# Patient Record
Sex: Male | Born: 1960 | Race: White | Hispanic: No | Marital: Married | Smoking: Never smoker
Health system: Southern US, Community
[De-identification: ages and names within clinical notes are randomized; demographics above are authoritative.]

## PROBLEM LIST (undated history)

## (undated) DIAGNOSIS — I10 Essential (primary) hypertension: Secondary | ICD-10-CM

---

## 2017-08-17 ENCOUNTER — Encounter (HOSPITAL_BASED_OUTPATIENT_CLINIC_OR_DEPARTMENT_OTHER): Payer: Self-pay

## 2017-08-17 ENCOUNTER — Emergency Department (HOSPITAL_BASED_OUTPATIENT_CLINIC_OR_DEPARTMENT_OTHER)
Admission: EM | Admit: 2017-08-17 | Discharge: 2017-08-17 | Disposition: A | Discharge status: Home / Self Care | Payer: 59 | Attending: Emergency Medicine | Admitting: Emergency Medicine

## 2017-08-17 ENCOUNTER — Other Ambulatory Visit: Payer: Self-pay

## 2017-08-17 ENCOUNTER — Emergency Department (HOSPITAL_BASED_OUTPATIENT_CLINIC_OR_DEPARTMENT_OTHER): Payer: 59

## 2017-08-17 DIAGNOSIS — Y9389 Activity, other specified: Secondary | ICD-10-CM | POA: Diagnosis not present

## 2017-08-17 DIAGNOSIS — S61312A Laceration without foreign body of right middle finger with damage to nail, initial encounter: Secondary | ICD-10-CM | POA: Diagnosis not present

## 2017-08-17 DIAGNOSIS — S62639A Displaced fracture of distal phalanx of unspecified finger, initial encounter for closed fracture: Secondary | ICD-10-CM | POA: Insufficient documentation

## 2017-08-17 DIAGNOSIS — W312XXA Contact with powered woodworking and forming machines, initial encounter: Secondary | ICD-10-CM | POA: Insufficient documentation

## 2017-08-17 DIAGNOSIS — Z23 Encounter for immunization: Secondary | ICD-10-CM | POA: Diagnosis not present

## 2017-08-17 DIAGNOSIS — I10 Essential (primary) hypertension: Secondary | ICD-10-CM | POA: Insufficient documentation

## 2017-08-17 DIAGNOSIS — S6981XA Other specified injuries of right wrist, hand and finger(s), initial encounter: Secondary | ICD-10-CM | POA: Diagnosis present

## 2017-08-17 DIAGNOSIS — Y999 Unspecified external cause status: Secondary | ICD-10-CM | POA: Diagnosis not present

## 2017-08-17 DIAGNOSIS — Y929 Unspecified place or not applicable: Secondary | ICD-10-CM | POA: Insufficient documentation

## 2017-08-17 HISTORY — DX: Essential (primary) hypertension: I10

## 2017-08-17 MED ORDER — TETANUS-DIPHTH-ACELL PERTUSSIS 5-2.5-18.5 LF-MCG/0.5 IM SUSP
0.5000 mL | Freq: Once | INTRAMUSCULAR | Status: AC
  Administered 2017-08-17: 0.5 mL via INTRAMUSCULAR
  Filled 2017-08-17: qty 0.5

## 2017-08-17 MED ORDER — OXYCODONE-ACETAMINOPHEN 5-325 MG PO TABS
1.0000 | ORAL_TABLET | Freq: Once | ORAL | Status: AC
  Administered 2017-08-17: 1 via ORAL
  Filled 2017-08-17: qty 1

## 2017-08-17 MED ORDER — LIDOCAINE HCL (PF) 1 % IJ SOLN
15.0000 mL | Freq: Once | INTRAMUSCULAR | Status: AC
  Administered 2017-08-17: 15 mL
  Filled 2017-08-17: qty 15

## 2017-08-17 MED ORDER — OXYCODONE-ACETAMINOPHEN 5-325 MG PO TABS: 1.0000 | ORAL_TABLET | Freq: Three times a day (TID) | ORAL | 0 refills | Status: DC | PRN

## 2017-08-17 MED ORDER — CEPHALEXIN 500 MG PO CAPS: 500.0000 mg | ORAL_CAPSULE | Freq: Four times a day (QID) | ORAL | 0 refills | Status: AC

## 2017-08-17 MED FILL — OXYCODONE-ACETAMINOPHEN 5-3: 5-325 | 4 days supply | Qty: 12 | Fill #0

## 2017-08-17 MED FILL — CEPHALEXIN 500 MG CAPSULE: 500 | 7 days supply | Qty: 28 | Fill #0

## 2017-08-17 NOTE — Discharge Instructions (Signed)
You were seen in the ED today with a laceration to the right middle finger. I repaired the laceration with stitches that will need to be removed in 1 week. Follow up with your PCP and the hand specialist listed. Take antibiotics as directed. Return to the ED sooner with any drainage from the wound, finger redness, fever, or sudden worsening pain.

## 2017-08-17 NOTE — ED Notes (Signed)
Pt to call PCP ask about DTap

## 2017-08-17 NOTE — ED Notes (Signed)
ED Provider at bedside. 

## 2017-08-17 NOTE — ED Triage Notes (Addendum)
Pt cut right middle finger with saw approx 30 min PTA-sent from PCP-dsg intact with no bleed through-NAD-steady gait

## 2017-08-17 NOTE — ED Provider Notes (Signed)
Emergency Department Provider Note   I have reviewed the triage vital signs and the nursing notes.   HISTORY  Chief Complaint Finger Injury   HPI Shane Tate is a 57 y.o. male resents to the emergency department for evaluation after injury to the right middle finger.  Patient was cutting some insulation material with a saw when he inadvertently cut his finger.  He has an injury through the tip of the right middle finger.  He is right-hand dominant.  No numbness or tingling in the finger.  He had pain immediately with significant bleeding which was controlled with direct pressure and he presented to the emergency department.  No additional areas were injured.  To needs to have some soreness in the finger.  No radiation of symptoms or modifying factors. Pain is moderate.    Past Medical History:  Diagnosis Date  . Hypertension     There are no active problems to display for this patient.   History reviewed. No pertinent surgical history.  Current Outpatient Rx  . Order #: [id number] Class: Historical Med  . Order #: [id number] Class: Print  . Order #: [id number] Class: Print    Allergies Sulfa antibiotics  No family history on file.  Social History Social History   Tobacco Use  . Smoking status: Never Smoker  . Smokeless tobacco: Never Used  Substance Use Topics  . Alcohol use: Yes    Frequency: Never    Comment: daily  . Drug use: No    Review of Systems  Constitutional: No fever/chills Eyes: No visual changes. ENT: No sore throat. Cardiovascular: Denies chest pain. Respiratory: Denies shortness of breath. Gastrointestinal: No abdominal pain.  No nausea, no vomiting.  No diarrhea.  No constipation. Genitourinary: Negative for dysuria. Musculoskeletal: Negative for back pain.  Skin: Negative for rash. Positive finger laceration.  Neurological: Negative for headaches, focal weakness or numbness.  10-point ROS otherwise  negative.  ____________________________________________   PHYSICAL EXAM:  VITAL SIGNS: ED Triage Vitals  Enc Vitals Group     BP 08/17/17 1421 (!) 140/97     Pulse Rate 08/17/17 1421 61     Resp 08/17/17 1421 16     Temp 08/17/17 1421 98.2 F (36.8 C)     Temp Source 08/17/17 1421 Oral     SpO2 08/17/17 1421 98 %     Weight 08/17/17 1421 200 lb 14.4 oz (91.1 kg)     Height 08/17/17 1421 5\' 11"  (1.803 m)     Pain Score 08/17/17 1419 5   Constitutional: Alert and oriented. Well appearing and in no acute distress. Eyes: Conjunctivae are normal. Head: Atraumatic. Nose: No congestion/rhinnorhea. Mouth/Throat: Mucous membranes are moist.   Neck: No stridor.   Cardiovascular: Good peripheral circulation.  Respiratory: Normal respiratory effort.  Gastrointestinal: No distention.  Musculoskeletal: No lower extremity tenderness nor edema. No gross deformities of extremities. Neurologic:  Normal speech and language. No gross focal neurologic deficits are appreciated.  Skin:  Skin is warm, dry and intact. Laceration through the distal right middle finger extending 0.25 cm into distal nail. Does not involve the germinal matrix. Total laceration length is 2 cm.   ____________________________________________  RADIOLOGY  Dg Finger Middle Right  Result Date: 08/17/2017 CLINICAL DATA:  Right middle finger laceration from a saw today. EXAM: RIGHT MIDDLE FINGER 2+V COMPARISON:  None. FINDINGS: Soft tissue irregularity and mildly comminuted underlying fracture of the ulnar, dorsal aspect of the 3rd distal tuft. There are small ventrally displaced fragments. Overlying bandage material.  IMPRESSION: Distal soft tissue laceration with an underlying mildly comminuted distal tuft fracture. Electronically Signed   By: Beckie SaltsSteven  Reid M.D.   On: 08/17/2017 14:34    ____________________________________________   PROCEDURES  Procedure(s) performed:   Marland Kitchen.Marland Kitchen.Laceration Repair Date/Time: 08/18/2017 1:06  PM Performed by: Maia PlanLong, Itai Barbian G, MD Authorized by: Maia PlanLong, Valen Mascaro G, MD   Consent:    Consent obtained:  Verbal   Consent given by:  Patient   Risks discussed:  Infection, nerve damage, need for additional repair, pain, poor cosmetic result, poor wound healing, vascular damage, tendon damage and retained foreign body   Alternatives discussed:  No treatment Anesthesia (see MAR for exact dosages):    Anesthesia method:  Nerve block   Block needle gauge:  25 G   Block anesthetic:  Lidocaine 1% w/o epi   Block technique:  Digital   Block injection procedure:  Anatomic landmarks identified, anatomic landmarks palpated, introduced needle, negative aspiration for blood and incremental injection   Block outcome:  Anesthesia achieved Laceration details:    Location:  Finger   Finger location:  R Tailyn Hantz finger   Length (cm):  2 Repair type:    Repair type:  Intermediate Pre-procedure details:    Preparation:  Patient was prepped and draped in usual sterile fashion and imaging obtained to evaluate for foreign bodies Exploration:    Hemostasis achieved with:  Direct pressure   Wound exploration: wound explored through full range of motion and entire depth of wound probed and visualized     Wound extent: underlying fracture and vascular damage     Wound extent: no foreign bodies/material noted, no muscle damage noted, no nerve damage noted and no tendon damage noted     Contaminated: no   Treatment:    Area cleansed with:  Betadine   Amount of cleaning:  Extensive   Irrigation solution:  Sterile saline   Irrigation method:  Pressure wash   Visualized foreign bodies/material removed: no   Skin repair:    Repair method:  Sutures   Suture size:  4-0   Suture material:  Prolene   Suture technique:  Simple interrupted   Number of sutures:  4 Approximation:    Approximation:  Close   Vermilion border: well-aligned   Post-procedure details:    Dressing:  Antibiotic ointment   Patient tolerance  of procedure:  Tolerated well, no immediate complications   ____________________________________________  INITIAL IMPRESSION / ASSESSMENT AND PLAN / ED COURSE  Pertinent labs & imaging results that were available during my care of the patient were reviewed by me and considered in my medical decision making (see chart for details).  Patient presents to the ED for evaluation of right middle finger pain and bleeding after cutting it with a saw. The laceration wraps around the distal tip of the right middle finger and involves a small portion of the distal nail. Germinal matrix of nailbed not involved and the fingernail with well-seated. Small underlying tuft fracture. Laceration cleaned and repaired as above. Patient started on Keflex and will f/u with PCP and Hand Surgery. Discussed timing for suture removal and wound care in detail.   At this time, I do not feel there is any life-threatening condition present. I have reviewed and discussed all results (EKG, imaging, lab, urine as appropriate), exam findings with patient. I have reviewed nursing notes and appropriate previous records.  I feel the patient is safe to be discharged home without further emergent workup. Discussed usual and customary return precautions.  Patient and family (if present) verbalize understanding and are comfortable with this plan.  Patient will follow-up with their primary care provider. If they do not have a primary care provider, information for follow-up has been provided to them. All questions have been answered.  ____________________________________________  FINAL CLINICAL IMPRESSION(S) / ED DIAGNOSES  Final diagnoses:  Laceration of right middle finger without foreign body with damage to nail, initial encounter  Closed fracture of tuft of distal phalanx of finger     MEDICATIONS GIVEN DURING THIS VISIT:  Medications  Tdap (BOOSTRIX) injection 0.5 mL (0.5 mLs Intramuscular Given 08/17/17 1534)  lidocaine (PF)  (XYLOCAINE) 1 % injection 15 mL (15 mLs Infiltration Given by Other 08/17/17 1538)  oxyCODONE-acetaminophen (PERCOCET/ROXICET) 5-325 MG per tablet 1 tablet (1 tablet Oral Given 08/17/17 1546)     NEW OUTPATIENT MEDICATIONS STARTED DURING THIS VISIT:  Discharge Medication List as of 08/17/2017  4:31 PM    START taking these medications   Details  cephALEXin (KEFLEX) 500 MG capsule Take 1 capsule (500 mg total) by mouth 4 (four) times daily for 7 days., Starting Wed 08/17/2017, Until Wed 08/24/2017, Print    oxyCODONE-acetaminophen (PERCOCET/ROXICET) 5-325 MG tablet Take 1 tablet by mouth every 8 (eight) hours as needed for severe pain., Starting Wed 08/17/2017, Print        Note:  This document was prepared using Dragon voice recognition software and may include unintentional dictation errors.  Alona Bene, MD Emergency Medicine    Lilyonna Steidle, Arlyss Repress, MD 08/18/17 1310

## 2018-07-21 ENCOUNTER — Inpatient Hospital Stay (HOSPITAL_COMMUNITY): Admission: RE | Admit: 2018-07-21 | Payer: 59 | Source: Ambulatory Visit

## 2018-07-31 ENCOUNTER — Inpatient Hospital Stay: Admit: 2018-07-31 | Payer: 59 | Admitting: Orthopedic Surgery

## 2018-07-31 SURGERY — ARTHROPLASTY, KNEE, TOTAL
Anesthesia: Spinal | Laterality: Left

## 2019-05-28 IMAGING — DX DG FINGER MIDDLE 2+V*R*
3 series · 3 of 3 positions shown · non-contrast
Comparison: None.

CLINICAL DATA: Right middle finger laceration from a saw today.

EXAM:
RIGHT MIDDLE FINGER 2+V

[finger ap]
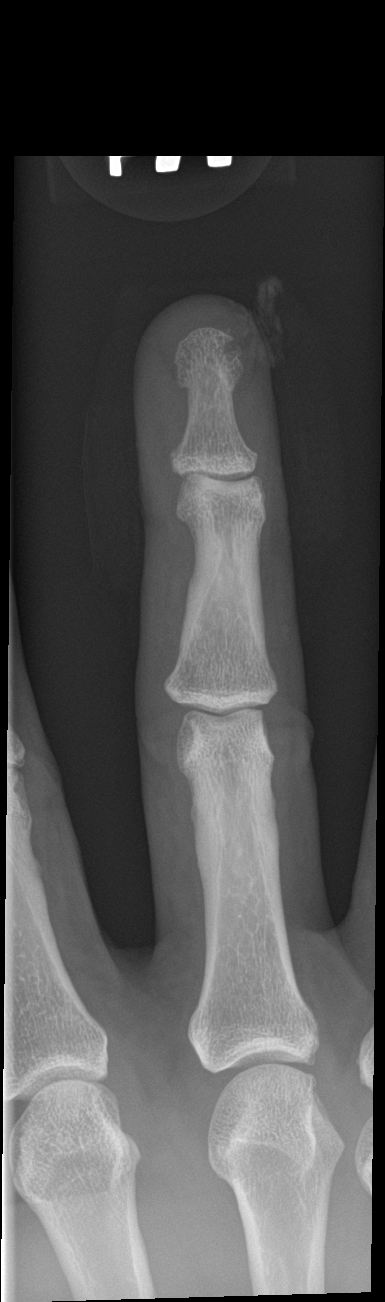

[finger obl]
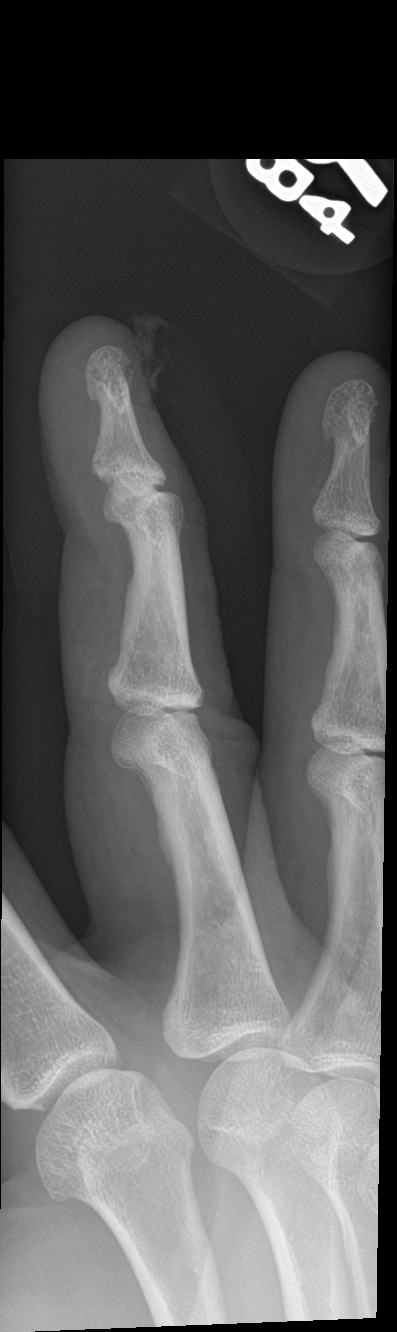

[finger lat]
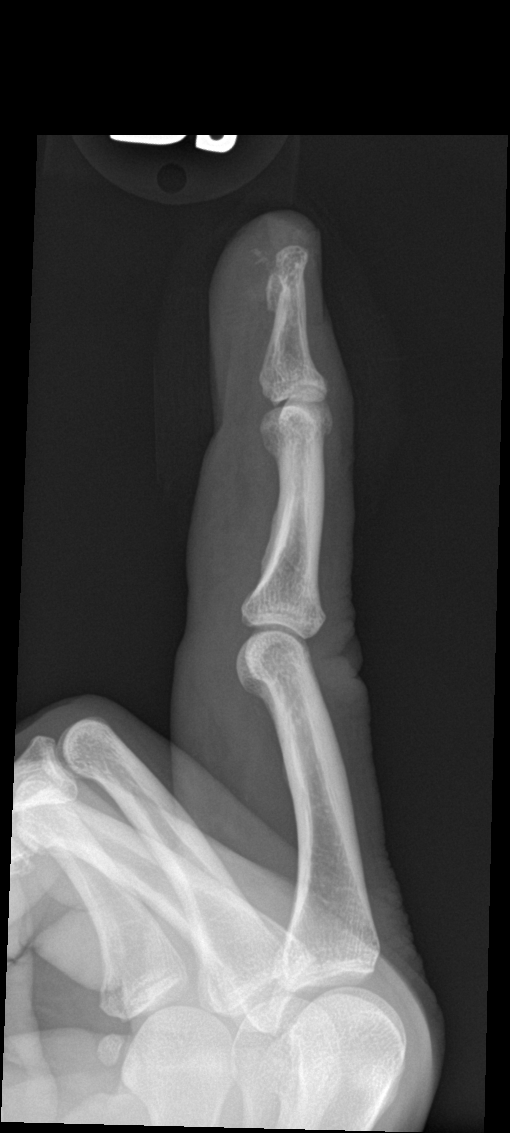

[3 of 3 positions shown; findings below may reference images not displayed]

FINDINGS: Soft tissue irregularity and mildly comminuted underlying fracture
of the ulnar, dorsal aspect of the 3rd distal tuft. There are small
ventrally displaced fragments. Overlying bandage material.
IMPRESSION: Distal soft tissue laceration with an underlying mildly comminuted
distal tuft fracture.

## 2023-12-13 ENCOUNTER — Other Ambulatory Visit: Payer: Self-pay | Admitting: Family Medicine

## 2023-12-13 DIAGNOSIS — E785 Hyperlipidemia, unspecified: Secondary | ICD-10-CM

## 2024-01-03 ENCOUNTER — Ambulatory Visit (INDEPENDENT_AMBULATORY_CARE_PROVIDER_SITE_OTHER): Payer: Self-pay

## 2024-01-03 DIAGNOSIS — E785 Hyperlipidemia, unspecified: Secondary | ICD-10-CM
